# Patient Record
Sex: Female | Born: 1975 | Race: White | Hispanic: No | Marital: Married | Smoking: Never smoker
Health system: Southern US, Community
[De-identification: ages and names within clinical notes are randomized; demographics above are authoritative.]

## PROBLEM LIST (undated history)

## (undated) DIAGNOSIS — N63 Unspecified lump in unspecified breast: Secondary | ICD-10-CM

## (undated) DIAGNOSIS — O019 Hydatidiform mole, unspecified: Secondary | ICD-10-CM

## (undated) HISTORY — DX: Unspecified lump in unspecified breast: N63.0

## (undated) HISTORY — DX: Hydatidiform mole, unspecified: O01.9

---

## 2001-04-23 DIAGNOSIS — N63 Unspecified lump in unspecified breast: Secondary | ICD-10-CM

## 2001-04-23 HISTORY — PX: BREAST BIOPSY: SHX20

## 2001-04-23 HISTORY — DX: Unspecified lump in unspecified breast: N63.0

## 2011-04-24 DIAGNOSIS — O019 Hydatidiform mole, unspecified: Secondary | ICD-10-CM

## 2011-04-24 HISTORY — PX: DILATION AND CURETTAGE OF UTERUS: SHX78

## 2011-04-24 HISTORY — DX: Hydatidiform mole, unspecified: O01.9

## 2013-12-19 ENCOUNTER — Emergency Department: Payer: Self-pay | Admitting: Emergency Medicine

## 2013-12-19 LAB — HCG, QUANTITATIVE, PREGNANCY: BETA HCG, QUANT.: 94653 m[IU]/mL — AB

## 2013-12-19 LAB — BASIC METABOLIC PANEL
Anion Gap: 8 (ref 7–16)
BUN: 9 mg/dL (ref 7–18)
CALCIUM: 8.6 mg/dL (ref 8.5–10.1)
Chloride: 105 mmol/L (ref 98–107)
Co2: 23 mmol/L (ref 21–32)
Creatinine: 0.85 mg/dL (ref 0.60–1.30)
EGFR (African American): >60
Glucose: 109 mg/dL — ABNORMAL HIGH (ref 65–99)
OSMOLALITY: 271 (ref 275–301)
Potassium: 4 mmol/L (ref 3.5–5.1)
Sodium: 136 mmol/L (ref 136–145)

## 2013-12-19 LAB — CBC WITH DIFFERENTIAL/PLATELET
BASOS ABS: 0.1 10*3/uL (ref 0.0–0.1)
Basophil %: 0.5 %
EOS ABS: 0 10*3/uL (ref 0.0–0.7)
Eosinophil %: 0.2 %
HCT: 38.2 % (ref 35.0–47.0)
HGB: 12.7 g/dL (ref 12.0–16.0)
Lymphocyte #: 1.6 10*3/uL (ref 1.0–3.6)
Lymphocyte %: 12.8 %
MCH: 28.6 pg (ref 26.0–34.0)
MCHC: 33.3 g/dL (ref 32.0–36.0)
MCV: 86 fL (ref 80–100)
MONO ABS: 0.5 x10 3/mm (ref 0.2–0.9)
Monocyte %: 3.7 %
Neutrophil #: 10.2 10*3/uL — ABNORMAL HIGH (ref 1.4–6.5)
Neutrophil %: 82.8 %
PLATELETS: 280 10*3/uL (ref 150–440)
RBC: 4.44 10*6/uL (ref 3.80–5.20)
RDW: 13.4 % (ref 11.5–14.5)
WBC: 12.3 10*3/uL — AB (ref 3.6–11.0)

## 2013-12-19 LAB — URINALYSIS, COMPLETE
BILIRUBIN, UR: NEGATIVE
Bacteria: NONE SEEN
Blood: NEGATIVE
GLUCOSE, UR: NEGATIVE mg/dL (ref 0–75)
Nitrite: NEGATIVE
PH: 5 (ref 4.5–8.0)
Protein: NEGATIVE
RBC,UR: 1 /HPF (ref 0–5)
Specific Gravity: 1.02 (ref 1.003–1.030)

## 2014-08-02 DIAGNOSIS — O019 Hydatidiform mole, unspecified: Secondary | ICD-10-CM | POA: Insufficient documentation

## 2015-03-01 IMAGING — US ABDOMEN ULTRASOUND LIMITED
1 series · 14 of 25 positions shown · non-contrast
Comparison: None.

CLINICAL DATA: Right upper quadrant pain

EXAM:
US ABDOMEN LIMITED - RIGHT UPPER QUADRANT

[Series 1: abdomen ultrasound limited · 0.27mm/px · 14 of 39 slices shown]
[im 1/39]
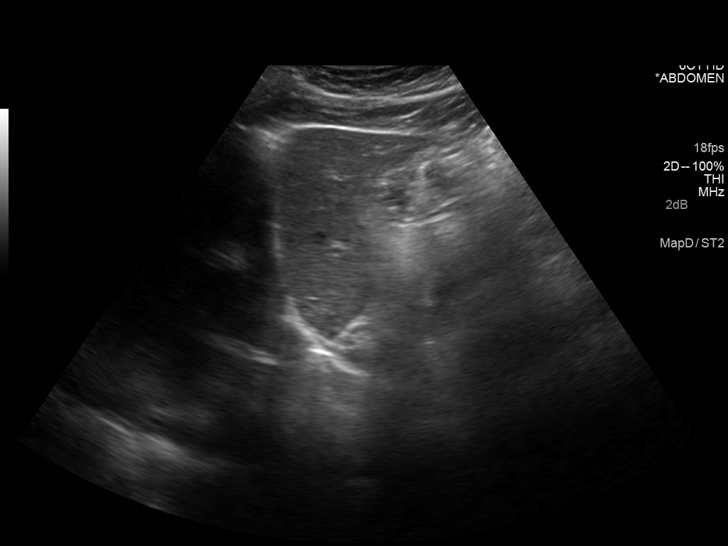
[im 4/39]
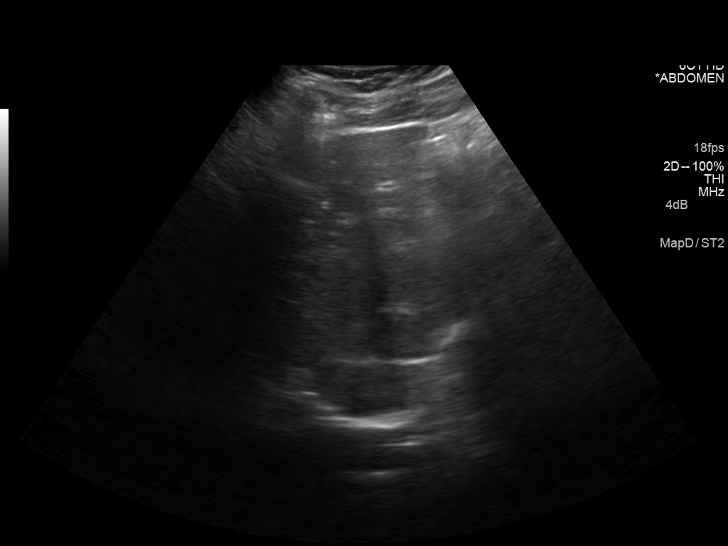
[im 7/39]
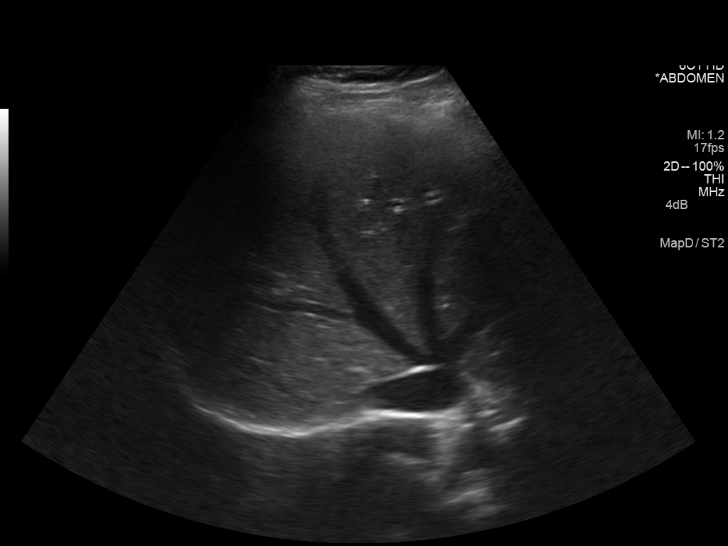
[im 10/39]
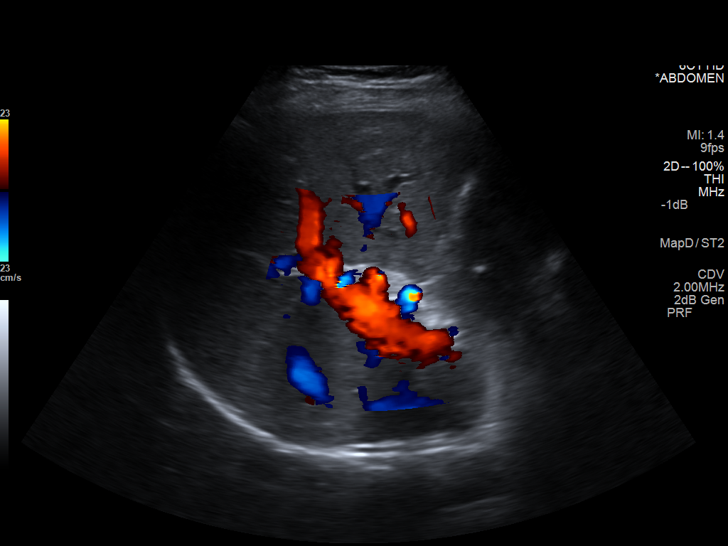
[im 13/39]
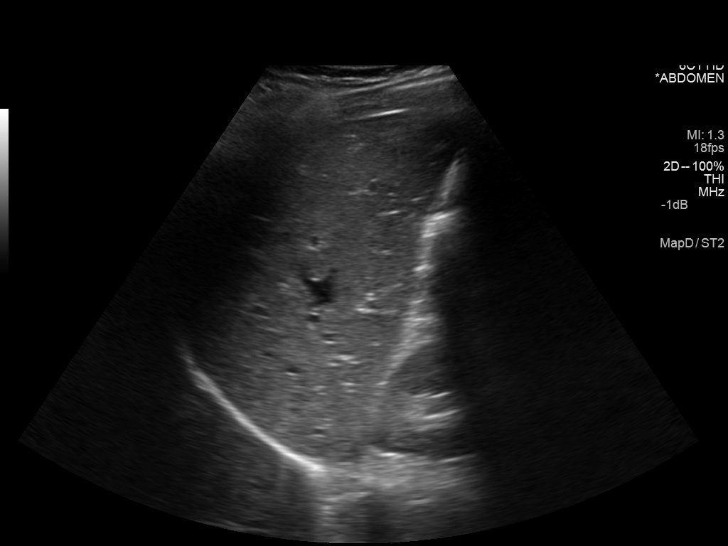
[im 15/39]
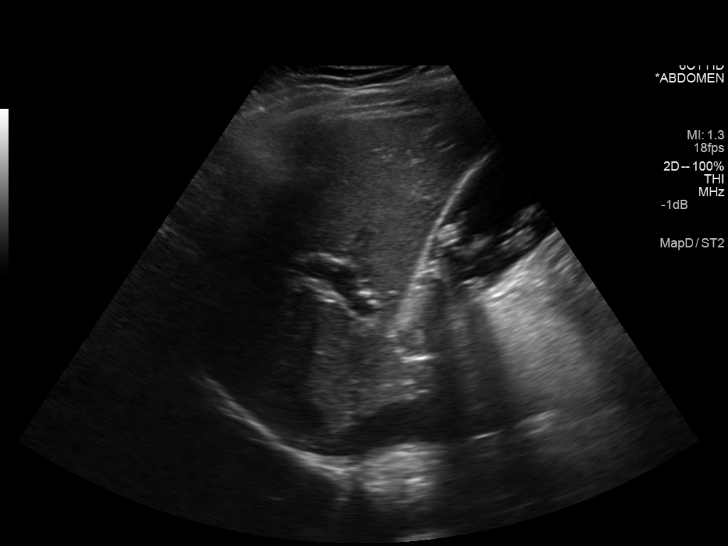
[im 18/39]
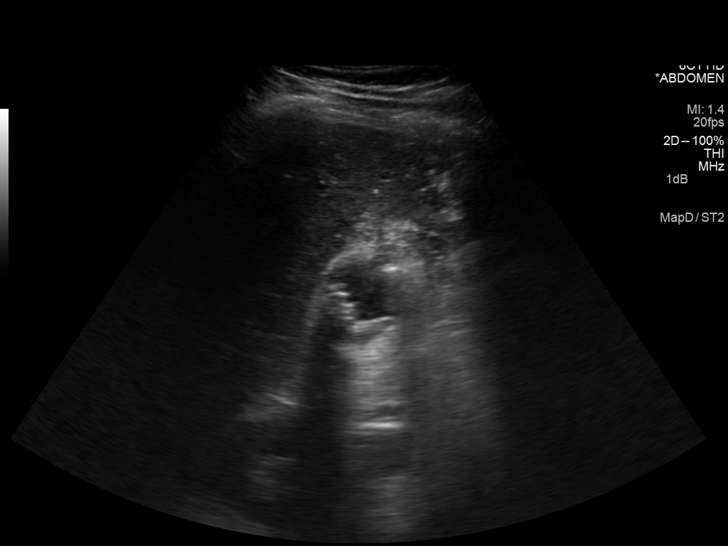
[im 21/39]
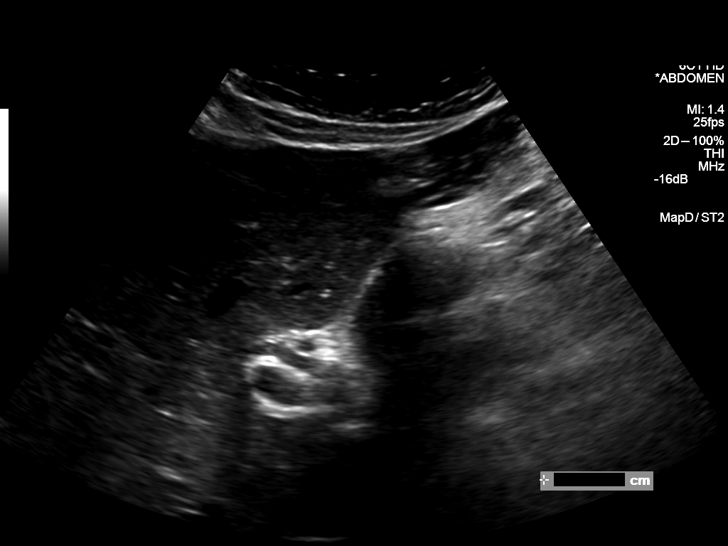
[im 24/39]
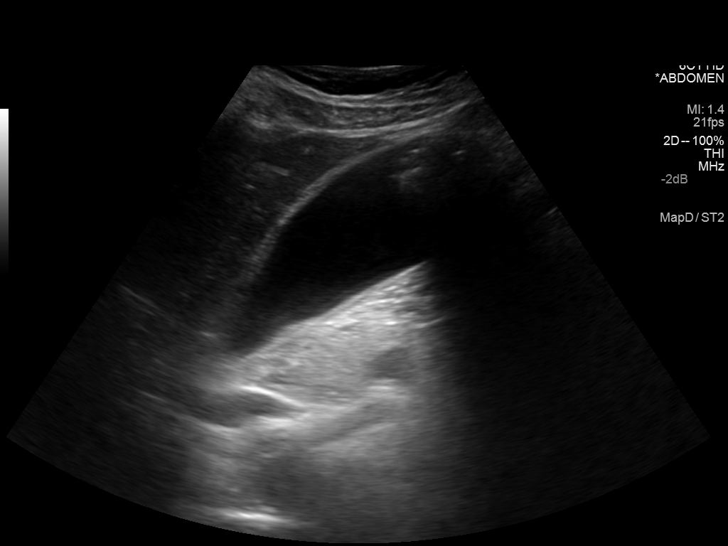
[im 26/39]
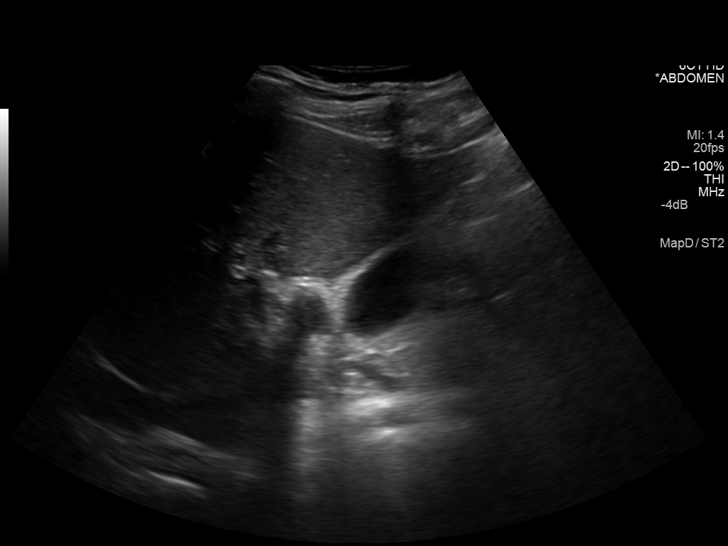
[im 29/39]
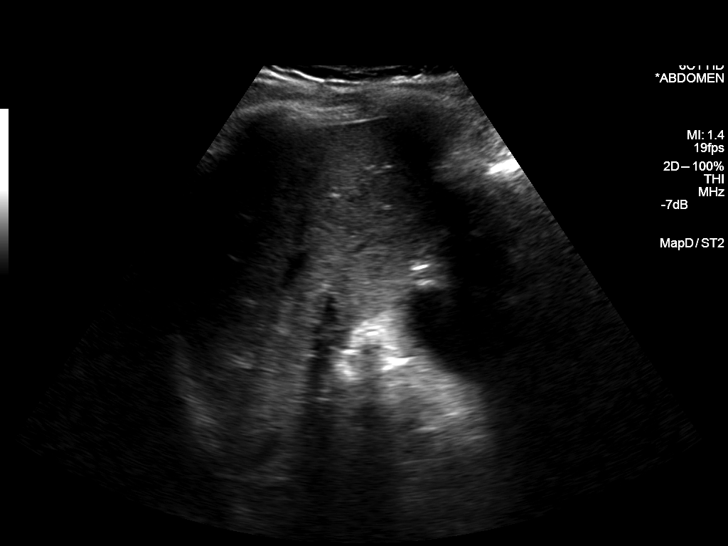
[im 32/39]
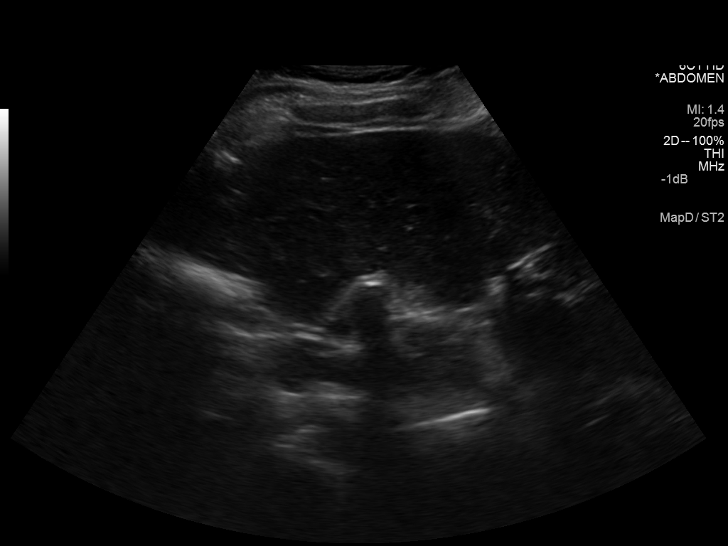
[im 35/39]
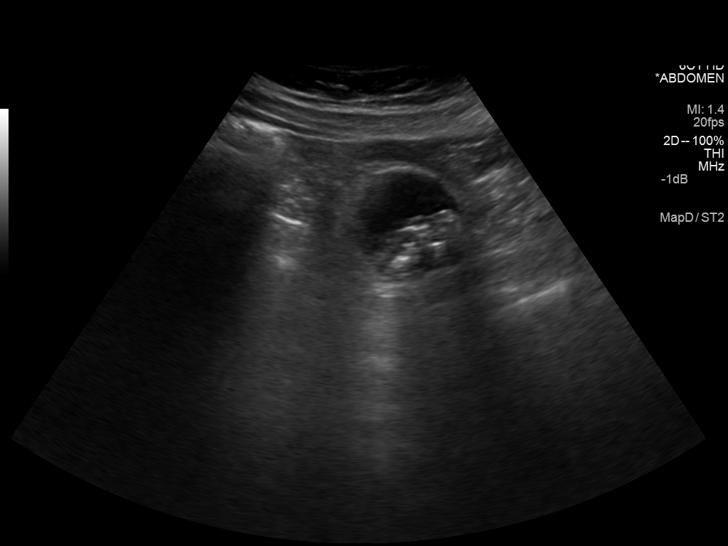
[im 39/39]
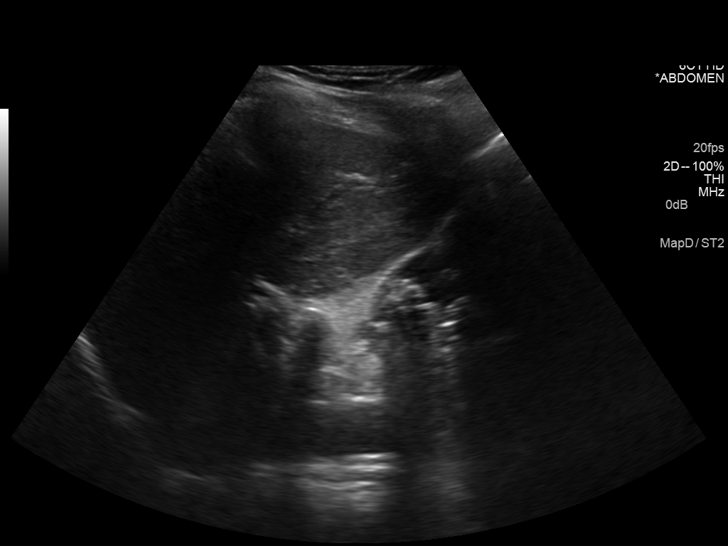

[14 of 25 positions shown; findings below may reference images not displayed]

FINDINGS: Gallbladder:

Multiple gallstones are noted. No gallbladder wall thickening or
positive sonographic Murphy sign is noted.

Common bile duct:

Diameter: 4.1 mm.

Liver:

No focal lesion identified. Within normal limits in parenchymal
echogenicity.
IMPRESSION: Cholelithiasis without complicating factors.

## 2015-03-01 IMAGING — US US OB < 14 WEEKS - US OB TV
1 series · 13 of 28 positions shown · non-contrast
Comparison: None.

CLINICAL DATA: Pelvic and back pain. Estimated gestational age by
LMP is 7 weeks 2 days. Quantitative beta HCG is [DATE].

EXAM:
OBSTETRIC <14 WK US AND TRANSVAGINAL OB US
TECHNIQUE: Both transabdominal and transvaginal ultrasound examinations were
performed for complete evaluation of the gestation as well as the
maternal uterus, adnexal regions, and pelvic cul-de-sac.
Transvaginal technique was performed to assess early pregnancy.

[Series 1: us ob < 14 weeks - us ob tv · 0.14mm/px · 13 of 63 slices shown]
[im 3/63]
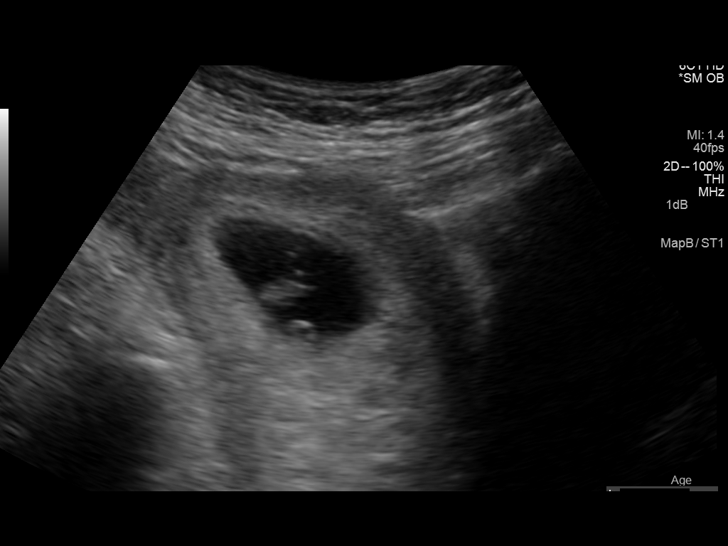
[im 7/63]
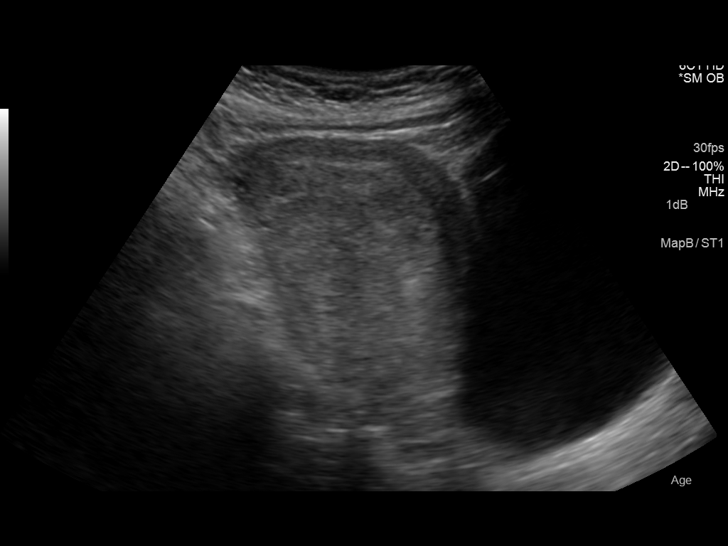
[im 12/63]
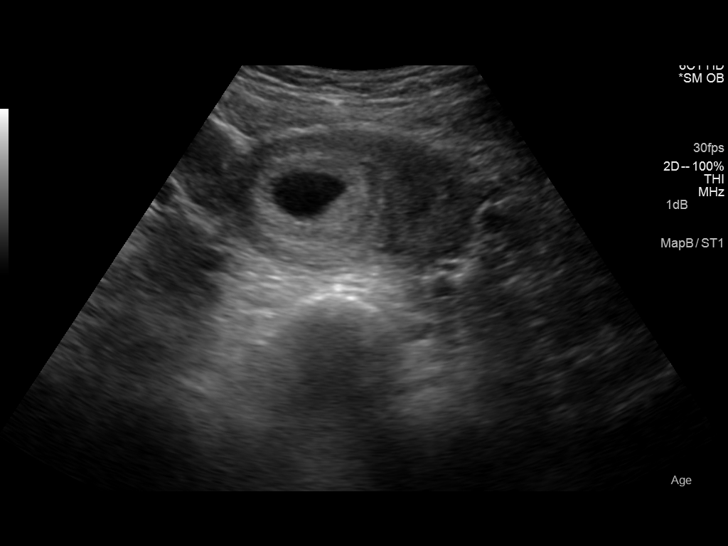
[im 17/63]
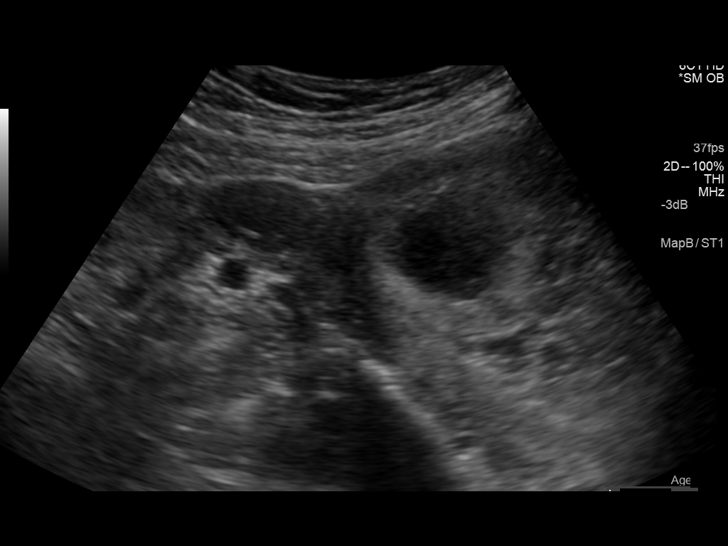
[im 21/63]
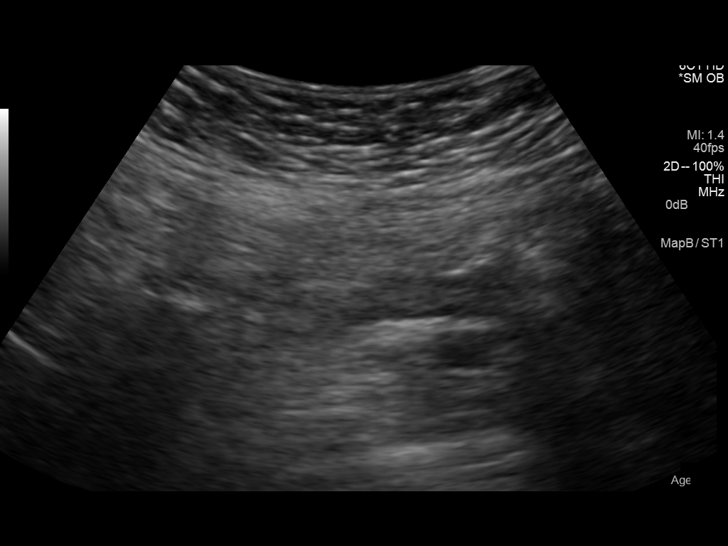
[im 26/63]
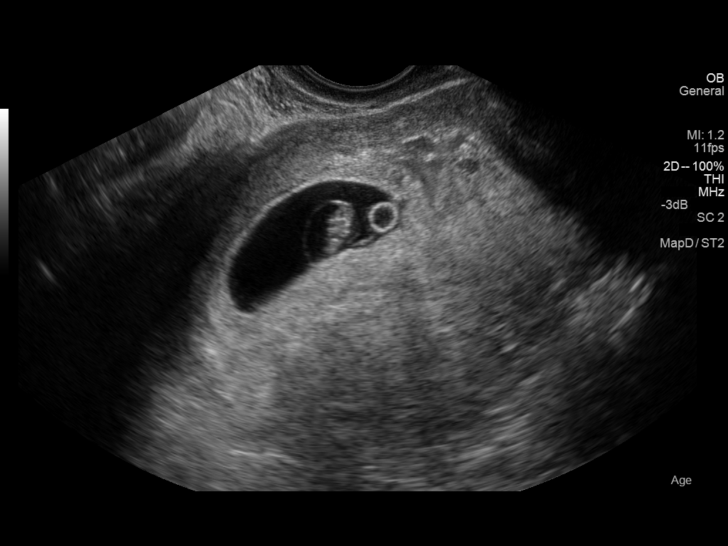
[im 33/63]
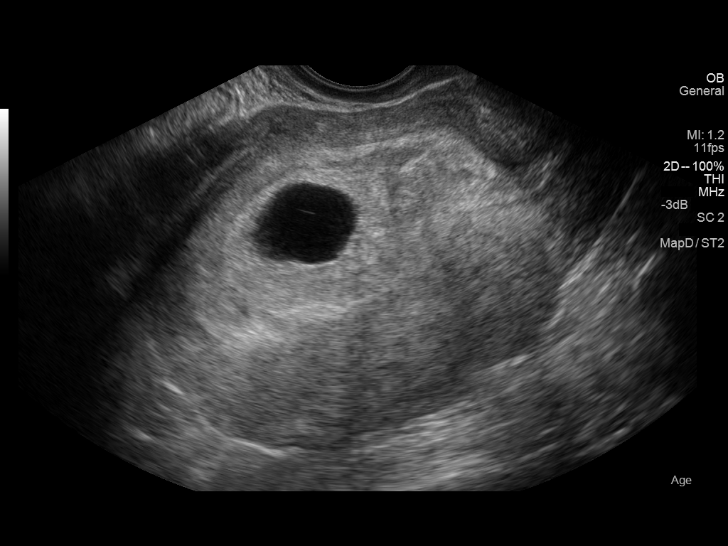
[im 37/63]
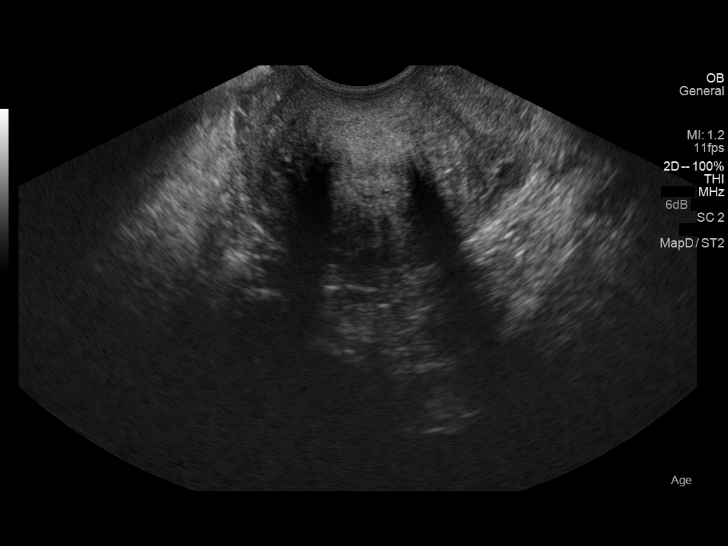
[im 42/63]
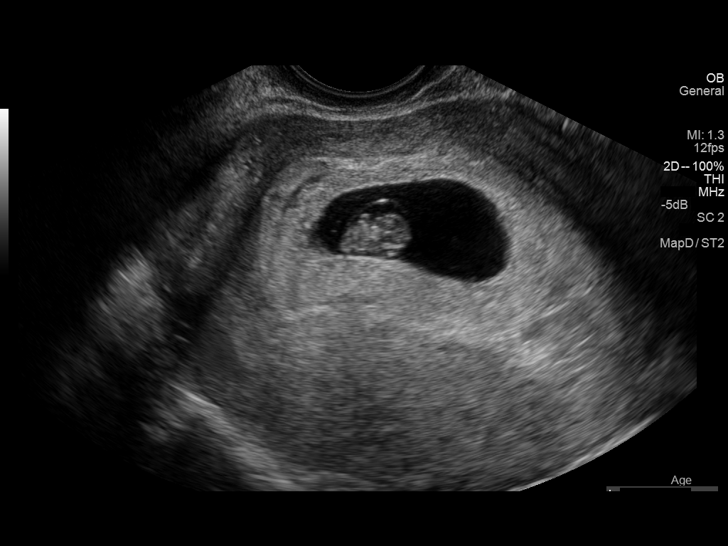
[im 46/63]
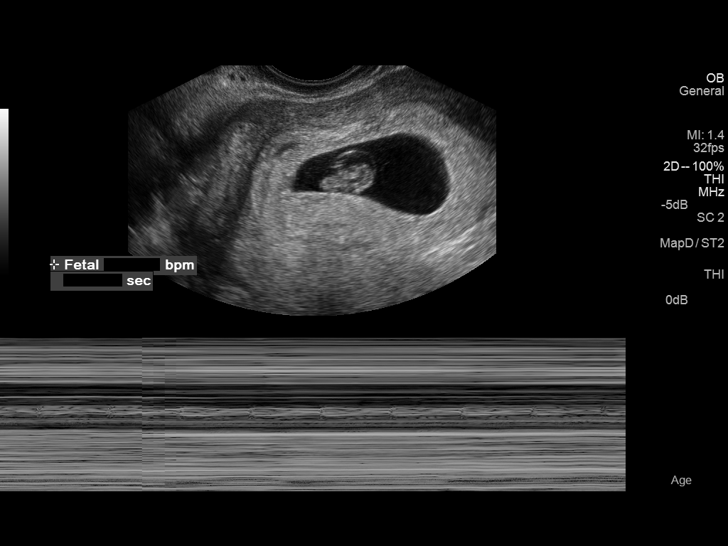
[im 51/63]
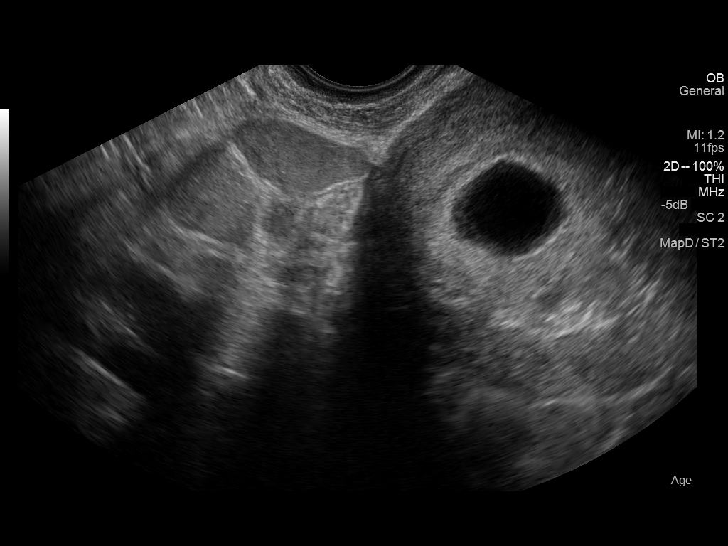
[im 56/63]
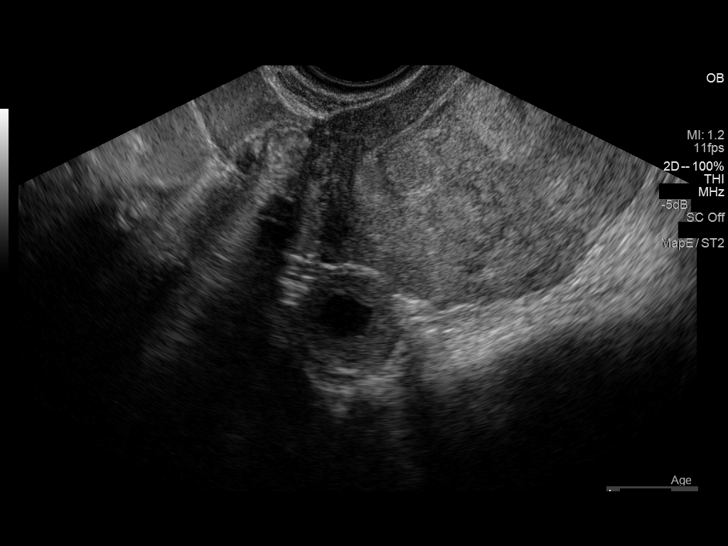
[im 60/63]
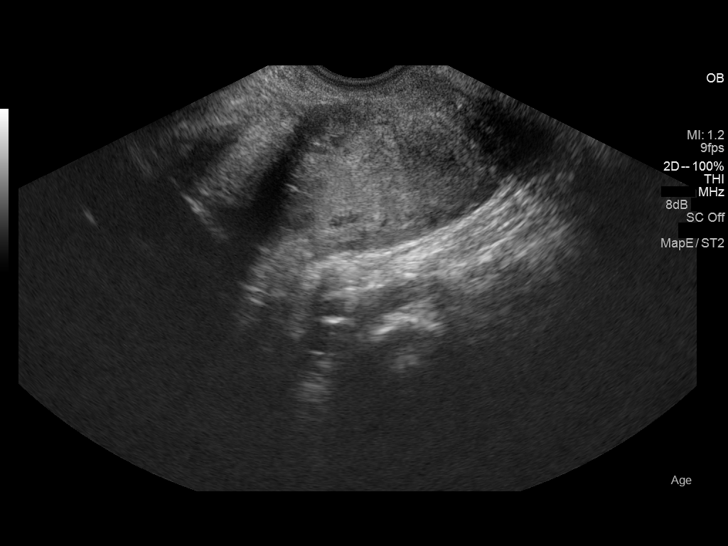

[13 of 28 positions shown; findings below may reference images not displayed]

FINDINGS: Intrauterine gestational sac: A single intrauterine gestational sac
is present.

Yolk sac:  Yolk sac is identified.

Embryo:  Fetal pole is present.

Cardiac Activity: Fetal cardiac activity is observed.

Heart Rate:  144 bpm

CRL:   11   mm   7 w 1 d                  US EDC: 08/06/2014

Maternal uterus/adnexae: Uterus is anteverted. No myometrial mass is
demonstrated. No subchorionic hemorrhage. Both ovaries are
visualized. Simple appearing cyst in the right ovary measuring 2 cm
maximal diameter. Likely corpus luteum cyst. No free pelvic fluid
collections.
IMPRESSION: Single intrauterine pregnancy. Estimated gestational age by
crown-rump length is 7 weeks 1 day. No acute complication is
suggested.

## 2015-07-05 ENCOUNTER — Encounter: Payer: Self-pay | Admitting: Internal Medicine

## 2015-07-05 ENCOUNTER — Ambulatory Visit (INDEPENDENT_AMBULATORY_CARE_PROVIDER_SITE_OTHER): Payer: BLUE CROSS/BLUE SHIELD | Admitting: Internal Medicine

## 2015-07-05 VITALS — BP 114/76 | HR 78 | Ht 64.0 in | Wt 177.1 lb

## 2015-07-05 DIAGNOSIS — D485 Neoplasm of uncertain behavior of skin: Secondary | ICD-10-CM | POA: Diagnosis not present

## 2015-07-05 DIAGNOSIS — O019 Hydatidiform mole, unspecified: Secondary | ICD-10-CM

## 2015-07-05 DIAGNOSIS — O010 Classical hydatidiform mole: Secondary | ICD-10-CM | POA: Diagnosis not present

## 2015-07-05 NOTE — Progress Notes (Signed)
    Date:  07/05/2015   Name:  Barbara Owen   DOB:  April 03, 1976   MRN:  [id number]   Chief Complaint: New Evaluation New patient here to establish for PCP.  Current contraception is condoms. She has a history of GTD in 2013.  At that time she had a miscarriage and the placenta was tested.  She subsequently underwent D&C and then chemotherapy.  She was followed closely for one year then released.  Last year she delivered a healthy baby by NSVD.  She is currently breast feeding. She noticed a couple of moles on her back that seemed to change during her pregnancy.  She would to have them looked at.  Otherwise she is healthy with no chronic medical problems.  Review of Systems  Constitutional: Negative for fever, chills and unexpected weight change.  Respiratory: Negative for chest tightness and shortness of breath.   Cardiovascular: Negative for chest pain, palpitations and leg swelling.  Gastrointestinal: Negative for abdominal pain.  Musculoskeletal: Negative for arthralgias.  Skin: Positive for color change.  Neurological: Negative for dizziness and headaches.  Psychiatric/Behavioral: Negative for sleep disturbance and dysphoric mood.    Patient Active Problem List   Diagnosis Date Noted  . 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 Authorizing Provider  PRENAT VIT-FE FUM-FA-FISH OIL PO Take 1 tablet by mouth daily.   Yes Historical Provider, MD    No Known Allergies  Past Surgical History  Procedure Laterality Date  . Dilation and curettage of uterus  2013    Social History  Substance Use Topics  . Smoking status: Never Smoker   . Smokeless tobacco: None  . Alcohol Use: No     Medication list has been reviewed and updated.   Physical Exam  Constitutional: She is oriented to person, place, and time. She appears well-developed. No distress.  HENT:  Head: Normocephalic and atraumatic.    Cardiovascular: Normal rate, regular rhythm and normal heart sounds.   Pulmonary/Chest: Effort normal and breath sounds normal. No respiratory distress.  Musculoskeletal: Normal range of motion.  Neurological: She is alert and oriented to person, place, and time. She has normal reflexes.  Skin: Skin is warm and dry. No rash noted.     Psychiatric: She has a normal mood and affect. Her behavior is normal. Thought content normal.  Nursing note and vitals reviewed.   BP 114/76 mmHg  Pulse 78  Ht 5\' 4"  (1.626 m)  Wt 177 lb 1.6 oz (80.332 kg)  BMI 30.38 kg/m2  LMP 06/19/2015  Assessment and Plan: 1. Neoplasm of uncertain behavior of skin Recommend Dermatology skin survey and consultation  2. Gestational trophoblastic disease S/p chemotherapy - now back to routine HM screenings Recommend CPX in 4-5 months here or with GYN of her choice   Halina Maidens, MD [facility]  07/05/2015

## 2015-11-10 ENCOUNTER — Ambulatory Visit (INDEPENDENT_AMBULATORY_CARE_PROVIDER_SITE_OTHER): Payer: BLUE CROSS/BLUE SHIELD | Admitting: Obstetrics and Gynecology

## 2015-11-10 ENCOUNTER — Encounter: Payer: Self-pay | Admitting: Obstetrics and Gynecology

## 2015-11-10 VITALS — BP 121/72 | HR 92 | Ht 64.0 in | Wt 180.3 lb

## 2015-11-10 DIAGNOSIS — O019 Hydatidiform mole, unspecified: Secondary | ICD-10-CM

## 2015-11-10 DIAGNOSIS — E669 Obesity, unspecified: Secondary | ICD-10-CM

## 2015-11-10 DIAGNOSIS — Z01419 Encounter for gynecological examination (general) (routine) without abnormal findings: Secondary | ICD-10-CM

## 2015-11-10 DIAGNOSIS — O010 Classical hydatidiform mole: Secondary | ICD-10-CM

## 2015-11-10 NOTE — Progress Notes (Signed)
GYNECOLOGY ANNUAL PHYSICAL EXAM PROGRESS NOTE  Subjective:    Barbara Owen is a 40 y.o. G49P4014 female who presents for an annual exam. The patient has no complaints today. The patient is sexually active.The patient wears seatbelts: yes. The patient participates in regular exercise: not regularly. Has the patient ever been transfused or tattooed?: yes. The patient reports that there is not domestic violence in her life.    Gynecologic History Patient's last menstrual period was 11/05/2015. Menarche age: 22  Contraception: condoms History of STI's: Denies Last Pap: 09/2014. Results were: normal.  Notes remote h/o abnormal pap smears (low grade, just had final repeat pap and it was normal).    Obstetric History   G5   P4   T4   P0   A1   TAB0   SAB1   E0   M0   L4     # Outcome Date GA Lbr Len/2nd Weight Sex Delivery Anes PTL Lv  5 Term 2016   6 lb 2.2 oz (2.785 kg) F Vag-Spont EPI  Y  4 SAB 2013 [redacted]w[redacted]d         3 Term 11/24/08   8 lb 1.6 oz (3.674 kg) F Vag-Spont EPI  Y  2 Term 2007   8 lb 1.6 oz (3.674 kg) M Vag-Spont None  Y  1 Term 2006   7 lb 2.2 oz (3.239 kg) F Vag-Spont EPI  Y      Past Medical History  Diagnosis Date  . 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 Breast lump 2003    Past Surgical History  Procedure Laterality Date  . Dilation and curettage of uterus  2013  . Breast biopsy Right 2003    benign    Family History  Problem Relation Age of Onset  . Esophageal cancer Father   . Atrial fibrillation Mother   . Hypertension Mother   . Diabetes Father     Social History   Social History  . Marital Status: Married    Spouse Name: N/A  . Number of Children: 4  . Years of Education: N/A   Occupational History  . Not on file.   Social History Main Topics  . Smoking status: Never Smoker   . Smokeless tobacco: Not on file  . Alcohol Use: No  . Drug Use: No  . Sexual Activity: Yes    Birth Control/ Protection: Condom   Other Topics Concern   . 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   No current facility-administered medications on file prior to visit.    No Known Allergies   Review of Systems Constitutional: negative for chills, fatigue, fevers and sweats Eyes: negative for irritation, redness and visual disturbance Ears, nose, mouth, throat, and face: negative for hearing loss, nasal congestion, snoring and tinnitus Respiratory: negative for asthma, cough, sputum Cardiovascular: negative for chest pain, dyspnea, exertional chest pressure/discomfort, irregular heart beat, palpitations and syncope Gastrointestinal: negative for abdominal pain, change in bowel habits, nausea and vomiting Genitourinary: negative for abnormal menstrual periods, genital lesions, sexual problems and vaginal discharge, dysuria and urinary incontinence Integument/breast: negative for breast lump, breast tenderness and nipple discharge Hematologic/lymphatic: negative for bleeding and easy bruising Musculoskeletal:negative for back pain and muscle weakness Neurological: negative for dizziness, headaches, vertigo and weakness Endocrine: negative for diabetic symptoms including polydipsia, polyuria  and skin dryness Allergic/Immunologic: negative for hay fever and urticaria     Objective:  Blood pressure 121/72, pulse 92, height 5\' 4"  (1.626 m), weight 180 lb 4.8 oz (81.784 kg), last menstrual period 11/05/2015, not currently breastfeeding. Body mass index is 30.93 kg/(m^2).  General Appearance:    Alert, cooperative, no distress, appears stated age, mildly obese  Head:    Normocephalic, without obvious abnormality, atraumatic  Eyes:    PERRL, conjunctiva/corneas clear, EOM's intact, both eyes  Ears:    Normal external ear canals, both ears  Nose:   Nares normal, septum midline, mucosa normal, no drainage or sinus tenderness  Throat:    Lips, mucosa, and tongue normal; teeth and gums normal  Neck:   Supple, symmetrical, trachea midline, no adenopathy; thyroid: no enlargement/tenderness/nodules; no carotid bruit or JVD  Back:     Symmetric, no curvature, ROM normal, no CVA tenderness  Lungs:     Clear to auscultation bilaterally, respirations unlabored  Chest Wall:    No tenderness or deformity   Heart:    Regular rate and rhythm, S1 and S2 normal, no murmur, rub or gallop  Breast Exam:    No tenderness, masses, or nipple abnormality  Abdomen:     Soft, non-tender, bowel sounds active all four quadrants, no masses, no organomegaly.    Genitalia:    Pelvic:external genitalia normal, vagina without lesions, discharge, or tenderness, rectovaginal septum  normal. Cervix normal in appearance, no cervical motion tenderness, no adnexal masses or tenderness.  Uterus normal size, shape, mobile, regular contours, nontender.  Rectal:    Normal external sphincter.  No hemorrhoids appreciated. Internal exam not done.   Extremities:   Extremities normal, atraumatic, no cyanosis or edema  Pulses:   2+ and symmetric all extremities  Skin:   Skin color, texture, turgor normal, no rashes or lesions  Lymph nodes:   Cervical, supraclavicular, and axillary nodes normal  Neurologic:   CNII-XII intact, normal strength, sensation and reflexes throughout    Labs:  Lab Results  Component Value Date   WBC 12.3* 12/19/2013   HGB 12.7 12/19/2013   HCT 38.2 12/19/2013   MCV 86 12/19/2013   PLT 280 12/19/2013    Lab Results  Component Value Date   CREATININE 0.85 12/19/2013   BUN 9 12/19/2013   NA 136 12/19/2013   K 4.0 12/19/2013   CL 105 12/19/2013   CO2 23 12/19/2013     Assessment:   Healthy female exam.   H/o Gestational Trophoblastic Neoplasia Mild Obesity  Plan:     All questions answered. Blood tests: CBC with diff, Comprehensive metabolic panel and Vitamin D level. Breast self exam technique reviewed and patient encouraged  to perform self-exam monthly. Contraception: condoms. Discussed healthy lifestyle modifications. Pap smear last performed 2016.  Remote h/o abnormal pap smear.  Next pap due 2019. H/o GTN, patient released from [place], can resume routine GYN care.  RTC in 1 year for annual exam.    Rubie Maid, MD [facility]

## 2015-11-11 LAB — COMPREHENSIVE METABOLIC PANEL
A/G RATIO: 2 (ref 1.2–2.2)
ALT: 10 IU/L (ref 0–32)
AST: 14 IU/L (ref 0–40)
Albumin: 4.5 g/dL (ref 3.5–5.5)
Alkaline Phosphatase: 73 IU/L (ref 39–117)
BUN / CREAT RATIO: 18 (ref 9–23)
BUN: 13 mg/dL (ref 6–20)
Bilirubin Total: 0.5 mg/dL (ref 0.0–1.2)
CALCIUM: 9.9 mg/dL (ref 8.7–10.2)
CHLORIDE: 103 mmol/L (ref 96–106)
CO2: 26 mmol/L (ref 18–29)
Creatinine, Ser: 0.73 mg/dL (ref 0.57–1.00)
GFR, EST AFRICAN AMERICAN: 121 mL/min/{1.73_m2} (ref >59)
GFR, EST NON AFRICAN AMERICAN: 105 mL/min/{1.73_m2} (ref >59)
GLOBULIN, TOTAL: 2.2 g/dL (ref 1.5–4.5)
Glucose: 59 mg/dL — ABNORMAL LOW (ref 65–99)
POTASSIUM: 4.8 mmol/L (ref 3.5–5.2)
SODIUM: 143 mmol/L (ref 134–144)
TOTAL PROTEIN: 6.7 g/dL (ref 6.0–8.5)

## 2015-11-11 LAB — CBC
Hematocrit: 38.5 % (ref 34.0–46.6)
Hemoglobin: 12.9 g/dL (ref 11.1–15.9)
MCH: 28.6 pg (ref 26.6–33.0)
MCHC: 33.5 g/dL (ref 31.5–35.7)
MCV: 85 fL (ref 79–97)
PLATELETS: 297 10*3/uL (ref 150–379)
RBC: 4.51 x10E6/uL (ref 3.77–5.28)
RDW: 13.3 % (ref 12.3–15.4)
WBC: 5.8 10*3/uL (ref 3.4–10.8)

## 2015-11-11 LAB — VITAMIN D 25 HYDROXY (VIT D DEFICIENCY, FRACTURES): Vit D, 25-Hydroxy: 28 ng/mL — ABNORMAL LOW (ref 30.0–100.0)

## 2015-11-14 ENCOUNTER — Telehealth: Payer: Self-pay

## 2015-11-14 NOTE — Telephone Encounter (Signed)
-----   Message from Rubie Maid, MD sent at 11/12/2015  9:47 PM EDT ----- Please inform of normal annual exam labs except mildly low Vit D levels.  Would recommend taking a regular multivitamin daily with added Vit D if she is not taking one.

## 2015-11-14 NOTE — Telephone Encounter (Signed)
Called pt informed her of the information below.

## 2016-10-03 ENCOUNTER — Telehealth: Payer: Self-pay | Admitting: Obstetrics and Gynecology

## 2016-10-03 NOTE — Telephone Encounter (Signed)
Please have pt scheduled for an appointment to discuss this as this will need to be discussed with physician. Thank you

## 2016-10-03 NOTE — Telephone Encounter (Signed)
Patient wants to talk with someone about recurrent yeast infections. Please call

## 2016-10-09 NOTE — Telephone Encounter (Signed)
Per patient she will discuss with Dr Marcelline Mates @ July appointment that is already scheduled

## 2016-11-13 ENCOUNTER — Encounter: Payer: BLUE CROSS/BLUE SHIELD | Admitting: Obstetrics and Gynecology

## 2016-11-20 ENCOUNTER — Encounter: Payer: Self-pay | Admitting: Obstetrics and Gynecology

## 2016-11-20 ENCOUNTER — Ambulatory Visit (INDEPENDENT_AMBULATORY_CARE_PROVIDER_SITE_OTHER): Payer: BLUE CROSS/BLUE SHIELD | Admitting: Obstetrics and Gynecology

## 2016-11-20 VITALS — BP 118/77 | HR 83 | Ht 64.0 in | Wt 180.2 lb

## 2016-11-20 DIAGNOSIS — Z01419 Encounter for gynecological examination (general) (routine) without abnormal findings: Secondary | ICD-10-CM

## 2016-11-20 DIAGNOSIS — E669 Obesity, unspecified: Secondary | ICD-10-CM

## 2016-11-20 DIAGNOSIS — L237 Allergic contact dermatitis due to plants, except food: Secondary | ICD-10-CM | POA: Diagnosis not present

## 2016-11-20 MED ORDER — PREDNISONE 10 MG (21) PO TBPK: ORAL_TABLET | ORAL | 0 refills | Status: DC

## 2016-11-20 NOTE — Progress Notes (Signed)
GYNECOLOGY ANNUAL PHYSICAL EXAM PROGRESS NOTE  Subjective:    Barbara Owen is a 41 y.o. G40P4014 female who presents for an annual exam.The patient is sexually active.The patient wears seatbelts: yes. The patient participates in regular exercise: not regularly. Has the patient ever been transfused or tattooed?: yes. The patient reports that there is not domestic violence in her life.    The patient has the following complaints today:  1. Patient states that she has been exposed to poison ivy.  Has been using topical treatments which have helped some, but is very slow healing (has had rash for almost 3 weeks).  Sherequests a prescription for steroids to clear up the rash.  States that when she was younger her Pediatrician used to have to prescribe steroids to help treat her contact rashes.  Notes that she does not have a PCP.    Gynecologic History Patient's last menstrual period was 10/30/2016. Menarche age: 60  Contraception: condoms History of STI's: Denies Last Pap: 09/2014. Results were: normal.  Notes remote h/o abnormal pap smear.   Obstetric History   G5   P4   T4   P0   A1   L4    SAB1   TAB0   Ectopic0   Multiple0   Live Births4     # Outcome Date GA Lbr Len/2nd Weight Sex Delivery Anes PTL Lv  5 Term 2016   6 lb 2.2 oz (2.785 kg) F Vag-Spont EPI  LIV  4 SAB 2013 [redacted]w[redacted]d         3 Term 11/24/08   8 lb 1.6 oz (3.674 kg) F Vag-Spont EPI  LIV  2 Term 2007   8 lb 1.6 oz (3.674 kg) M Vag-Spont None  LIV  1 Term 2006   7 lb 2.2 oz (3.239 kg) F Vag-Spont EPI  LIV      Past Medical History:  Diagnosis Date  . Breast lump 2003  . Trophoblastic neoplasm 2013   S/P Chemo    Past Surgical History:  Procedure Laterality Date  . BREAST BIOPSY Right 2003   benign  . DILATION AND CURETTAGE OF UTERUS  2013    Family History  Problem Relation Age of Onset  . Atrial fibrillation Mother   . Hypertension Mother   . Esophageal cancer Father   . Diabetes Father   . Breast  cancer Neg Hx   . Ovarian cancer Neg Hx     Social History   Social History  . Marital status: Married    Spouse name: N/A  . Number of children: 4  . Years of education: N/A   Occupational History  . Not on file.   Social History Main Topics  . Smoking status: Never Smoker  . Smokeless tobacco: Never Used  . Alcohol use No  . Drug use: No  . Sexual activity: Yes    Birth control/ protection: Condom   Other Topics Concern  . 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   No current facility-administered medications on file prior to visit.     No Known Allergies   Review of Systems Constitutional: negative for chills, fatigue, fevers and sweats Eyes: negative for irritation, redness and visual disturbance Ears, nose, mouth, throat, and face: negative for hearing loss, nasal congestion, snoring and tinnitus Respiratory: negative for asthma, cough, sputum Cardiovascular: negative  for chest pain, dyspnea, exertional chest pressure/discomfort, irregular heart beat, palpitations and syncope Gastrointestinal: negative for abdominal pain, change in bowel habits, nausea and vomiting Genitourinary: negative for abnormal menstrual periods, genital lesions, sexual problems and vaginal discharge, dysuria and urinary incontinence Breast: negative for breast lump, breast tenderness and nipple discharge Hematologic/lymphatic: negative for bleeding and easy bruising Musculoskeletal:negative for back pain and muscle weakness Neurological: negative for dizziness, headaches, vertigo and weakness Endocrine: negative for diabetic symptoms including polydipsia, polyuria and skin dryness Dermatological ROS: positive for rash, itching.  Negative for eczema and lumps Allergic/Immunologic: negative for hay fever and urticaria     Objective:  Blood pressure 118/77, pulse 83, height 5\' 4"   (1.626 m), weight 180 lb 3.2 oz (81.7 kg), last menstrual period 10/30/2016. Body mass index is 30.93 kg/m.  General Appearance:    Alert, cooperative, no distress, appears stated age, mildly obese  Head:    Normocephalic, without obvious abnormality, atraumatic  Eyes:    PERRL, conjunctiva/corneas clear, EOM's intact, both eyes  Ears:    Normal external ear canals, both ears  Nose:   Nares normal, septum midline, mucosa normal, no drainage or sinus tenderness  Throat:   Lips, mucosa, and tongue normal; teeth and gums normal  Neck:   Supple, symmetrical, trachea midline, no adenopathy; thyroid: no enlargement/tenderness/nodules; no carotid bruit or JVD  Back:     Symmetric, no curvature, ROM normal, no CVA tenderness  Lungs:     Clear to auscultation bilaterally, respirations unlabored  Chest Wall:    No tenderness or deformity   Heart:    Regular rate and rhythm, S1 and S2 normal, no murmur, rub or gallop  Breast Exam:    No tenderness, masses, or nipple abnormality  Abdomen:     Soft, non-tender, bowel sounds active all four quadrants, no masses, no organomegaly.    Genitalia:    Pelvic:external genitalia normal, vagina without lesions, discharge, or tenderness, rectovaginal septum  normal. Cervix normal in appearance, no cervical motion tenderness, no adnexal masses or tenderness.  Uterus normal size, shape, mobile, regular contours, nontender.  Rectal:    Normal external sphincter.  No hemorrhoids appreciated. Internal exam not done.   Extremities:   Extremities normal, atraumatic, no cyanosis or edema  Pulses:   2+ and symmetric all extremities  Skin:  scaly peeling rash noted along left forearm and lower leg  Lymph nodes:   Cervical, supraclavicular, and axillary nodes normal  Neurologic:   CNII-XII intact, normal strength, sensation and reflexes throughout    Labs:  Lab Results  Component Value Date   WBC 5.8 11/10/2015   HGB 12.9 11/10/2015   HCT 38.5 11/10/2015   MCV 85  11/10/2015   PLT 297 11/10/2015    Lab Results  Component Value Date   CREATININE 0.73 11/10/2015   BUN 13 11/10/2015   NA 143 11/10/2015   K 4.8 11/10/2015   CL 103 11/10/2015   CO2 26 11/10/2015     Assessment:   Healthy female exam.   Mild Obesity Poison ivy  Plan:     All questions answered. Blood tests: CBC with diff, Comprehensive metabolic panel. Breast self exam technique reviewed and patient encouraged to perform self-exam monthly. Contraception: condoms. Discussed healthy lifestyle modifications. Pap smear last performed 2016.  Remote h/o abnormal pap smear.  Next pap due 2019. Will prescribe Prednisone dose pack for poison ivy. To also continue topical therapy.  RTC in 1 year for annual exam.    Marcelline Mates,  Dolphus Jenny, MD [facility]

## 2016-11-21 LAB — COMPREHENSIVE METABOLIC PANEL
ALK PHOS: 69 IU/L (ref 39–117)
ALT: 9 IU/L (ref 0–32)
AST: 16 IU/L (ref 0–40)
Albumin/Globulin Ratio: 1.9 (ref 1.2–2.2)
Albumin: 4.8 g/dL (ref 3.5–5.5)
BUN/Creatinine Ratio: 14 (ref 9–23)
BUN: 12 mg/dL (ref 6–20)
Bilirubin Total: 0.5 mg/dL (ref 0.0–1.2)
CO2: 20 mmol/L (ref 20–29)
CREATININE: 0.87 mg/dL (ref 0.57–1.00)
Calcium: 9.9 mg/dL (ref 8.7–10.2)
Chloride: 104 mmol/L (ref 96–106)
GFR calc Af Amer: 97 mL/min/{1.73_m2} (ref >59)
GFR calc non Af Amer: 84 mL/min/{1.73_m2} (ref >59)
GLOBULIN, TOTAL: 2.5 g/dL (ref 1.5–4.5)
Glucose: 73 mg/dL (ref 65–99)
POTASSIUM: 4.6 mmol/L (ref 3.5–5.2)
SODIUM: 142 mmol/L (ref 134–144)
Total Protein: 7.3 g/dL (ref 6.0–8.5)

## 2016-11-21 LAB — CBC
HEMOGLOBIN: 14.7 g/dL (ref 11.1–15.9)
Hematocrit: 43.5 % (ref 34.0–46.6)
MCH: 29.3 pg (ref 26.6–33.0)
MCHC: 33.8 g/dL (ref 31.5–35.7)
MCV: 87 fL (ref 79–97)
Platelets: 303 10*3/uL (ref 150–379)
RBC: 5.02 x10E6/uL (ref 3.77–5.28)
RDW: 13.5 % (ref 12.3–15.4)
WBC: 5.6 10*3/uL (ref 3.4–10.8)

## 2017-11-27 ENCOUNTER — Encounter: Payer: BLUE CROSS/BLUE SHIELD | Admitting: Obstetrics and Gynecology

## 2021-06-24 ENCOUNTER — Ambulatory Visit
Admission: EM | Admit: 2021-06-24 | Discharge: 2021-06-24 | Disposition: A | Discharge status: Home / Self Care | Payer: 59 | Attending: Physician Assistant | Admitting: Physician Assistant

## 2021-06-24 ENCOUNTER — Other Ambulatory Visit: Payer: Self-pay

## 2021-06-24 ENCOUNTER — Encounter: Payer: Self-pay | Admitting: Emergency Medicine

## 2021-06-24 DIAGNOSIS — R3989 Other symptoms and signs involving the genitourinary system: Secondary | ICD-10-CM | POA: Insufficient documentation

## 2021-06-24 DIAGNOSIS — B9689 Other specified bacterial agents as the cause of diseases classified elsewhere: Secondary | ICD-10-CM | POA: Insufficient documentation

## 2021-06-24 DIAGNOSIS — N760 Acute vaginitis: Secondary | ICD-10-CM | POA: Diagnosis not present

## 2021-06-24 LAB — URINALYSIS, MICROSCOPIC (REFLEX)

## 2021-06-24 LAB — URINALYSIS, ROUTINE W REFLEX MICROSCOPIC
Bilirubin Urine: NEGATIVE
Glucose, UA: NEGATIVE mg/dL
Hgb urine dipstick: NEGATIVE
Ketones, ur: NEGATIVE mg/dL
Nitrite: NEGATIVE
Protein, ur: NEGATIVE mg/dL
Specific Gravity, Urine: 1.02 (ref 1.005–1.030)
pH: 5.5 (ref 5.0–8.0)

## 2021-06-24 LAB — WET PREP, GENITAL
Sperm: NONE SEEN
Trich, Wet Prep: NONE SEEN
WBC, Wet Prep HPF POC: >10 — AB (ref <10)
Yeast Wet Prep HPF POC: NONE SEEN

## 2021-06-24 MED ORDER — METRONIDAZOLE 500 MG PO TABS: 500.0000 mg | ORAL_TABLET | Freq: Two times a day (BID) | ORAL | 0 refills | Status: AC

## 2021-06-24 NOTE — ED Triage Notes (Signed)
Pt c/o dysuria, urinary urgency, urinary frequency. Started about 2 weeks. She states her symptoms usually resolve by the end of the day each day, but keep coming back.  ?

## 2021-06-24 NOTE — ED Provider Notes (Signed)
?[facility] ? ? ? ?CSN: [id number] ?Arrival date & time: 06/24/21  1040 ? ? ?  ? ?History   ?Chief Complaint ?Chief Complaint  ?Patient presents with  ? Dysuria  ? ? ?HPI ?Barbara Owen is a 46 y.o. female presenting for 2-week history of urinary urgency/frequency and bladder pressure.  Patient reports this happens in the mornings and then she has no symptoms throughout the day.  Concern for UTI.  Denies vaginal discharge, itching or odor.  Denies fever, chills, flank pain or abdominal pain. ? ?HPI ? ?Past Medical History:  ?Diagnosis Date  ? Breast lump 2003  ? Trophoblastic neoplasm 2013  ? S/P Chemo  ? ? ?Patient Active Problem List  ? Diagnosis Date Noted  ? 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 ? ?Past Surgical History:  ?Procedure Laterality Date  ? BREAST BIOPSY Right 2003  ? benign  ? DILATION AND CURETTAGE OF UTERUS  2013  ? ? ?OB History   ? ? Gravida  ?5  ? Para  ?4  ? Term  ?4  ? Preterm  ?   ? AB  ?1  ? Living  ?4  ?  ? ? SAB  ?1  ? IAB  ?   ? Ectopic  ?   ? Multiple  ?   ? Live Births  ?4  ?   ?  ?  ? ? ? ?Home Medications   ? ?Prior to Admission medications   ?Medication Sig Start Date End Date Taking? Authorizing Provider  ?metroNIDAZOLE (FLAGYL) 500 MG tablet Take 1 tablet (500 mg total) by mouth 2 (two) times daily for 7 days. 06/24/21 07/01/21 Yes Danton Clap, PA-C  ? ? ?Family History ?Family History  ?Problem Relation Age of Onset  ? Atrial fibrillation Mother   ? Hypertension Mother   ? Esophageal cancer Father   ? Diabetes Father   ? Breast cancer Neg Hx   ? Ovarian cancer Neg Hx   ? ? ?Social History ?Social History  ? ?Tobacco Use  ? Smoking status: Never  ? Smokeless tobacco: Never  ?Vaping Use  ? Vaping Use: Never used  ?Substance Use Topics  ? Alcohol use: No  ?  Alcohol/week: 0.0 standard drinks  ? Drug use: No  ? ? ? ?Allergies   ?Patient has no known allergies. ? ? ?Review of Systems ?Review of Systems   ?Constitutional:  Negative for fatigue and fever.  ?Gastrointestinal:  Negative for abdominal pain.  ?Genitourinary:  Positive for frequency and urgency. Negative for dysuria, flank pain, hematuria, vaginal bleeding, vaginal discharge and vaginal pain.  ?Musculoskeletal:  Negative for back pain.  ?Skin:  Negative for rash.  ? ? ?Physical Exam ?Triage Vital Signs ?ED Triage Vitals  ?Enc Vitals Group  ?   BP 06/24/21 1123 109/71  ?   Pulse Rate 06/24/21 1123 98  ?   Resp 06/24/21 1123 18  ?   Temp 06/24/21 1123 98.1 ?F (36.7 ?C)  ?   Temp Source 06/24/21 1123 Oral  ?   SpO2 06/24/21 1123 100 %  ?   Weight 06/24/21 1120 180 lb 1.9 oz (81.7 kg)  ?   Height 06/24/21 1120 '5\' 4"'$  (1.626 m)  ?   Head Circumference --   ?   Peak Flow --   ?   Pain Score 06/24/21 1120 0  ?   Pain Loc --   ?   Pain  Edu? --   ?   Excl. in [place]? --   ? ?No data found. ? ?Updated Vital Signs ?BP 109/71 (BP Location: Left Arm)   Pulse 98   Temp 98.1 ?F (36.7 ?C) (Oral)   Resp 18   Ht '5\' 4"'$  (1.626 m)   Wt 180 lb 1.9 oz (81.7 kg)   LMP 06/03/2021 (Approximate)   SpO2 100%   BMI 30.92 kg/m?  ?  ? ?Physical Exam ?Vitals and nursing note reviewed.  ?Constitutional:   ?   General: She is not in acute distress. ?   Appearance: Normal appearance. She is not ill-appearing or toxic-appearing.  ?HENT:  ?   Head: Normocephalic and atraumatic.  ?Eyes:  ?   General: No scleral icterus.    ?   Right eye: No discharge.     ?   Left eye: No discharge.  ?   Conjunctiva/sclera: Conjunctivae normal.  ?Cardiovascular:  ?   Rate and Rhythm: Normal rate and regular rhythm.  ?   Heart sounds: Normal heart sounds.  ?Pulmonary:  ?   Effort: Pulmonary effort is normal. No respiratory distress.  ?   Breath sounds: Normal breath sounds.  ?Abdominal:  ?   Palpations: Abdomen is soft.  ?   Tenderness: There is no abdominal tenderness. There is no right CVA tenderness or left CVA tenderness.  ?Musculoskeletal:  ?   Cervical back: Neck supple.  ?Skin: ?   General: Skin is  dry.  ?Neurological:  ?   General: No focal deficit present.  ?   Mental Status: She is alert. Mental status is at baseline.  ?   Motor: No weakness.  ?   Gait: Gait normal.  ?Psychiatric:     ?   Mood and Affect: Mood normal.     ?   Behavior: Behavior normal.     ?   Thought Content: Thought content normal.  ? ? ? ?UC Treatments / Results  ?Labs ?(all labs ordered are listed, but only abnormal results are displayed) ?Labs Reviewed  ?WET PREP, GENITAL - Abnormal; Notable for the following components:  ?    Result Value  ? Clue Cells Wet Prep HPF POC PRESENT (*)   ? WBC, Wet Prep HPF POC >10 (*)   ? All other components within normal limits  ?URINALYSIS, ROUTINE W REFLEX MICROSCOPIC - Abnormal; Notable for the following components:  ? Leukocytes,Ua TRACE (*)   ? All other components within normal limits  ?URINALYSIS, MICROSCOPIC (REFLEX) - Abnormal; Notable for the following components:  ? Bacteria, UA FEW (*)   ? All other components within normal limits  ? ? ?EKG ? ? ?Radiology ?No results found. ? ?Procedures ?Procedures (including critical care time) ? ?Medications Ordered in UC ?Medications - No data to display ? ?Initial Impression / Assessment and Plan / UC Course  ?I have reviewed the triage vital signs and the nursing notes. ? ?Pertinent labs & imaging results that were available during my care of the patient were reviewed by me and considered in my medical decision making (see chart for details). ? ?45 year old female presenting for 2-week history of bladder pressure and urinary frequency.  Denies dysuria.  No vaginal discharge. ? ?Vital signs normal stable and patient overall well-appearing elects to perform vaginal self swab only the urine sample.  UA shows no evidence of urinary tract infection.  Wet prep does show clue cells.  Discussed results with patient.  Advised her vaginitis can sometimes mimic UTIs.  Symptoms should improve with antibiotics.  Will treat with metronidazole.  Advised following up  as needed with PCP if not improving over the next 1 week or if symptoms worsen. ? ? ?Final Clinical Impressions(s) / UC Diagnoses  ? ?Final diagnoses:  ?BV (bacterial vaginosis)  ?Sensation of pressure in bladder area  ? ? ? ?Discharge Instructions   ? ?  ?-No sign of UTI on urine test ?-You have BV. See handout. It can mimic a UTI ? ? ? ? ?ED Prescriptions   ? ? Medication Sig Dispense Auth. Provider  ? metroNIDAZOLE (FLAGYL) 500 MG tablet Take 1 tablet (500 mg total) by mouth 2 (two) times daily for 7 days. 14 tablet Danton Clap, PA-C  ? ?  ? ?PDMP not reviewed this encounter. ?  ?Danton Clap, PA-C ?06/24/21 1255 ? ?

## 2021-06-24 NOTE — Discharge Instructions (Addendum)
-  No sign of UTI on urine test ?-You have BV. See handout. It can mimic a UTI ?

## 2021-09-05 ENCOUNTER — Ambulatory Visit
Admission: EM | Admit: 2021-09-05 | Discharge: 2021-09-05 | Disposition: A | Discharge status: Home / Self Care | Payer: 59 | Attending: Internal Medicine | Admitting: Internal Medicine

## 2021-09-05 DIAGNOSIS — R0789 Other chest pain: Secondary | ICD-10-CM | POA: Diagnosis not present

## 2021-09-05 DIAGNOSIS — R079 Chest pain, unspecified: Secondary | ICD-10-CM

## 2021-09-05 NOTE — ED Provider Notes (Signed)
?[facility] ? ? ? ?CSN: [id number] ?Arrival date & time: 09/05/21  1325 ? ? ?  ? ?History   ?Chief Complaint ?Chief Complaint  ?Patient presents with  ? Hypertension  ? ? ?HPI ?Barbara Owen is a 46 y.o. female.  ? ?HPI ? ?46 year old female here for evaluation of chest pressure. ? ?Patient reports that she has been experiencing central chest pressure for the last 2 to 3 days that is episodic.  She states that when it comes on it last for few minutes and then will go away.  She notices it more at rest and does not notice it much when she is active.  She and her husband walk their dogs nightly.  She states that she drinks 1 cup of coffee in the morning and then maybe a cup in the afternoon of decaf.  She does not utilize any stimulants and she is not a smoker.  She has no significant past medical history and denies any personal cardiac history.  Her mom does have a history of atrial fibrillation.  This chest pressure is not associated with shortness of breath, sweating, dizziness, nausea, vomiting, or syncope.  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 ? ?Past Medical History:  ?Diagnosis Date  ? Breast lump 2003  ? Trophoblastic neoplasm 2013  ? S/P Chemo  ? ? ?Patient Active Problem List  ? Diagnosis Date Noted  ? Pain in the chest 09/05/2021  ? 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 ? ?Past Surgical History:  ?Procedure Laterality Date  ? BREAST BIOPSY Right 2003  ? benign  ? DILATION AND CURETTAGE OF UTERUS  2013  ? ? ?OB History   ? ? Gravida  ?5  ? Para  ?4  ? Term  ?4  ? Preterm  ?   ? AB  ?1  ? Living  ?4  ?  ? ? SAB  ?1  ? IAB  ?   ? Ectopic  ?   ? Multiple  ?   ? Live Births  ?4  ?   ?  ?  ? ? ? ?Home Medications   ? ?Prior to Admission medications   ?Not on File  ? ? ?Family History ?Family History  ?Problem Relation Age of Onset  ? Atrial fibrillation Mother   ? Hypertension Mother   ?  Esophageal cancer Father   ? Diabetes Father   ? Breast cancer Neg Hx   ? Ovarian cancer Neg Hx   ? ? ?Social History ?Social History  ? ?Tobacco Use  ? Smoking status: Never  ? Smokeless tobacco: Never  ?Vaping Use  ? Vaping Use: Never used  ?Substance Use Topics  ? Alcohol use: No  ?  Alcohol/week: 0.0 standard drinks  ? Drug use: No  ? ? ? ?Allergies   ?Patient has no known allergies. ? ? ?Review of Systems ?Review of Systems  ?Constitutional:  Negative for diaphoresis and fever.  ?Respiratory:  Negative for shortness of breath and wheezing.   ?Cardiovascular:  Negative for chest pain and palpitations.  ?Gastrointestinal:  Negative for nausea.  ?Neurological:  Negative for dizziness and syncope.  ?Hematological: Negative.   ?Psychiatric/Behavioral: Negative.    ? ? ?Physical Exam ?Triage Vital Signs ?ED Triage Vitals  ?Enc Vitals Group  ?   BP 09/05/21 1500 (!) 146/89  ?   Pulse Rate 09/05/21 1500 67  ?  Resp 09/05/21 1500 18  ?   Temp 09/05/21 1500 99 ?F (37.2 ?C)  ?   Temp Source 09/05/21 1500 Oral  ?   SpO2 09/05/21 1500 100 %  ?   Weight 09/05/21 1458 185 lb (83.9 kg)  ?   Height 09/05/21 1458 '5\' 4"'$  (1.626 m)  ?   Head Circumference --   ?   Peak Flow --   ?   Pain Score 09/05/21 1457 0  ?   Pain Loc --   ?   Pain Edu? --   ?   Excl. in [place]? --   ? ?No data found. ? ?Updated Vital Signs ?BP (!) 146/89 (BP Location: Left Arm)   Pulse 67   Temp 99 ?F (37.2 ?C) (Oral)   Resp 18   Ht '5\' 4"'$  (1.626 m)   Wt 185 lb (83.9 kg)   LMP 08/13/2021   SpO2 100%   BMI 31.76 kg/m?  ? ?Visual Acuity ?Right Eye Distance:   ?Left Eye Distance:   ?Bilateral Distance:   ? ?Right Eye Near:   ?Left Eye Near:    ?Bilateral Near:    ? ?Physical Exam ?Vitals and nursing note reviewed.  ?Constitutional:   ?   Appearance: Normal appearance. She is not ill-appearing.  ?HENT:  ?   Head: Normocephalic and atraumatic.  ?Neck:  ?   Vascular: No carotid bruit.  ?Cardiovascular:  ?   Rate and Rhythm: Normal rate and regular rhythm.  ?    Pulses: Normal pulses.  ?   Heart sounds: Normal heart sounds. No murmur heard. ?  No friction rub. No gallop.  ?Pulmonary:  ?   Effort: Pulmonary effort is normal.  ?   Breath sounds: Normal breath sounds. No wheezing, rhonchi or rales.  ?Musculoskeletal:  ?   Cervical back: Normal range of motion and neck supple.  ?Skin: ?   General: Skin is warm and dry.  ?   Capillary Refill: Capillary refill takes less than 2 seconds.  ?   Findings: No erythema or rash.  ?Neurological:  ?   General: No focal deficit present.  ?   Mental Status: She is alert and oriented to person, place, and time.  ?Psychiatric:     ?   Mood and Affect: Mood normal.     ?   Behavior: Behavior normal.     ?   Thought Content: Thought content normal.     ?   Judgment: Judgment normal.  ? ? ? ?UC Treatments / Results  ?Labs ?(all labs ordered are listed, but only abnormal results are displayed) ?Labs Reviewed - No data to display ? ?EKG ?Sinus rhythm with PVCs and ventricular rate of 75 bpm ?PR interval 130 ms ?QRS duration 80 ms ?QT/QTc 414/462 ms no ST elevation or depression, no T wave abnormalities. ? ? ?Radiology ?No results found. ? ?Procedures ?Procedures (including critical care time) ? ?Medications Ordered in UC ?Medications - No data to display ? ?Initial Impression / Assessment and Plan / UC Course  ?I have reviewed the triage vital signs and the nursing notes. ? ?Pertinent labs & imaging results that were available during my care of the patient were reviewed by me and considered in my medical decision making (see chart for details). ? ?Patient is a pleasant, nontoxic-appearing 79 old female here for evaluation of episodic substernal chest pressure that is been going on for last 2 to 3 days.  She states she notices it more when  she is at rest and less so when she is up and active.  She denies any pain, nausea, shortness of breath, sweating, dizziness, or syncope to go along with this.  The pain does not increase with exertion.  Her  physical exam reveals S1-S2 heart sounds with an occasional PVC but otherwise a regular rate and rhythm.  Lung sounds are clear to auscultation all fields.  When auscultating carotid arteries bilaterally there is no bruit appreciable.  Peripheral pulses are 2+ globally.  No swelling in any of her extremities.  Cap refills less than 2 seconds.  EKG was collected at triage and shows sinus rhythm with a single PVC but otherwise normal.  No other tracings on file for comparison.  Patient does not utilize any stimulants and she has not had any increase in stress.  She states she drinks maybe 1 to 2 glasses of water a day but also drinks 1 cup of caffeine in the morning.  I suspect that her chest pressure is not cardiac in nature especially considering that it is cyclical and episodic lasting a few minutes and then resolving before coming back again.  There is nothing that is constant about it.  I have offered the patient some blood work to check electrolytes as well as cardiac enzymes and she has declined at this point.  I have advised her that if anything changes she is welcome to come back for reevaluation.  I did tell her that if she develops any chest pressure that comes on and stays, especially if it is associated with sweating, dizziness, nausea, fatigue, or syncope that she should call 911 and go to the emergency department. ? ? ?Final Clinical Impressions(s) / UC Diagnoses  ? ?Final diagnoses:  ?Chest pain, unspecified type  ?Sensation of chest pressure  ? ? ? ?Discharge Instructions   ? ?  ?Your EKG was very reassuring as was your physical exam. ? ?You can try increasing your oral fluid intake of water and seeing if improving her hydration improves her symptoms. ? ?If you have any chest pressure, or pain, that comes on and stays on please go to the ER for evaluation.  Especially if it is associated with sweating, dizziness, nausea, or fainting. ? ? ? ? ?ED Prescriptions   ?None ?  ? ?PDMP not reviewed this  encounter. ?  ?Margarette Canada, NP ?09/05/21 1613 ? ?

## 2021-09-05 NOTE — Discharge Instructions (Addendum)
Your EKG was very reassuring as was your physical exam. ? ?You can try increasing your oral fluid intake of water and seeing if improving her hydration improves her symptoms. ? ?If you have any chest pressure, or pain, that comes on and stays on please go to the ER for evaluation.  Especially if it is associated with sweating, dizziness, nausea, or fainting. ?

## 2021-09-05 NOTE — ED Triage Notes (Addendum)
Pt c/o chest pressure x2-3 days. Pt states that she is not having any chest pain, only pressure. Pt states that it feels like something is pressing her chest. ? ?Pt used the BP cuff at [facility] and states that her BP is elevated. Pt readings are : ?153/82 ?136/80 ?146/80 ? ? ?
